# Patient Record
Sex: Female | Born: 1950 | Hispanic: No | Marital: Married | Smoking: Never smoker
Health system: Southern US, Community
[De-identification: ages and names within clinical notes are randomized; demographics above are authoritative.]

## PROBLEM LIST (undated history)

## (undated) DIAGNOSIS — I10 Essential (primary) hypertension: Secondary | ICD-10-CM

## (undated) HISTORY — DX: Essential (primary) hypertension: I10

---

## 1997-12-20 ENCOUNTER — Other Ambulatory Visit: Admission: RE | Admit: 1997-12-20 | Discharge: 1997-12-20 | Payer: Self-pay | Admitting: Family Medicine

## 1999-05-10 ENCOUNTER — Emergency Department (HOSPITAL_COMMUNITY): Admission: EM | Admit: 1999-05-10 | Discharge: 1999-05-10 | Payer: Self-pay | Admitting: Internal Medicine

## 1999-05-20 ENCOUNTER — Emergency Department (HOSPITAL_COMMUNITY): Admission: EM | Admit: 1999-05-20 | Discharge: 1999-05-21 | Payer: Self-pay | Admitting: Emergency Medicine

## 1999-05-20 ENCOUNTER — Encounter: Payer: Self-pay | Admitting: Emergency Medicine

## 2004-04-07 ENCOUNTER — Ambulatory Visit: Payer: Self-pay | Admitting: Internal Medicine

## 2004-05-07 ENCOUNTER — Ambulatory Visit: Payer: Self-pay | Admitting: *Deleted

## 2004-06-11 ENCOUNTER — Ambulatory Visit: Payer: Self-pay | Admitting: *Deleted

## 2004-08-12 ENCOUNTER — Ambulatory Visit: Payer: Self-pay | Admitting: Internal Medicine

## 2004-08-13 ENCOUNTER — Ambulatory Visit: Payer: Self-pay | Admitting: *Deleted

## 2004-08-18 ENCOUNTER — Encounter: Admission: RE | Admit: 2004-08-18 | Discharge: 2004-08-18 | Payer: Self-pay | Admitting: Family Medicine

## 2005-03-31 ENCOUNTER — Ambulatory Visit: Payer: Self-pay | Admitting: Internal Medicine

## 2005-11-25 ENCOUNTER — Ambulatory Visit: Payer: Self-pay | Admitting: Internal Medicine

## 2005-12-16 ENCOUNTER — Ambulatory Visit: Payer: Self-pay | Admitting: Internal Medicine

## 2006-05-11 ENCOUNTER — Ambulatory Visit: Payer: Self-pay | Admitting: Internal Medicine

## 2006-05-28 ENCOUNTER — Ambulatory Visit: Payer: Self-pay | Admitting: Family Medicine

## 2006-08-13 ENCOUNTER — Ambulatory Visit: Payer: Self-pay | Admitting: Internal Medicine

## 2006-09-15 ENCOUNTER — Ambulatory Visit: Payer: Self-pay | Admitting: Internal Medicine

## 2007-03-30 ENCOUNTER — Encounter (INDEPENDENT_AMBULATORY_CARE_PROVIDER_SITE_OTHER): Payer: Self-pay | Admitting: *Deleted

## 2007-08-25 ENCOUNTER — Ambulatory Visit: Payer: Self-pay | Admitting: Internal Medicine

## 2007-09-08 ENCOUNTER — Ambulatory Visit: Payer: Self-pay | Admitting: Internal Medicine

## 2007-09-08 LAB — CONVERTED CEMR LAB
BUN: 20 mg/dL (ref 6–23)
CO2: 23 meq/L (ref 19–32)
Calcium: 9.7 mg/dL (ref 8.4–10.5)
Chloride: 103 meq/L (ref 96–112)
Creatinine, Ser: 0.71 mg/dL (ref 0.40–1.20)
Glucose, Bld: 96 mg/dL (ref 70–99)
Potassium: 4.1 meq/L (ref 3.5–5.3)
Sodium: 142 meq/L (ref 135–145)

## 2007-09-22 ENCOUNTER — Ambulatory Visit (HOSPITAL_COMMUNITY): Admission: RE | Admit: 2007-09-22 | Discharge: 2007-09-22 | Payer: Self-pay | Admitting: Family Medicine

## 2008-03-20 ENCOUNTER — Ambulatory Visit: Payer: Self-pay | Admitting: Internal Medicine

## 2008-07-25 ENCOUNTER — Ambulatory Visit: Payer: Self-pay | Admitting: Internal Medicine

## 2008-10-31 ENCOUNTER — Encounter: Admission: RE | Admit: 2008-10-31 | Discharge: 2008-10-31 | Payer: Self-pay | Admitting: Family Medicine

## 2009-07-02 ENCOUNTER — Ambulatory Visit: Payer: Self-pay | Admitting: Family Medicine

## 2009-09-24 ENCOUNTER — Emergency Department (HOSPITAL_COMMUNITY): Admission: EM | Admit: 2009-09-24 | Discharge: 2009-09-24 | Payer: Self-pay | Admitting: Family Medicine

## 2009-11-01 ENCOUNTER — Encounter: Admission: RE | Admit: 2009-11-01 | Discharge: 2009-11-01 | Payer: Self-pay | Admitting: Family Medicine

## 2010-01-29 ENCOUNTER — Ambulatory Visit: Payer: Self-pay | Admitting: Internal Medicine

## 2010-01-29 LAB — CONVERTED CEMR LAB
ALT: 15 units/L (ref 0–35)
AST: 16 units/L (ref 0–37)
Basophils Absolute: 0 10*3/uL (ref 0.0–0.1)
Basophils Relative: 0 % (ref 0–1)
Creatinine, Ser: 0.58 mg/dL (ref 0.40–1.20)
Eosinophils Relative: 3 % (ref 0–5)
HCT: 38.6 % (ref 36.0–46.0)
Hemoglobin: 12.8 g/dL (ref 12.0–15.0)
MCHC: 33.2 g/dL (ref 30.0–36.0)
Monocytes Absolute: 0.6 10*3/uL (ref 0.1–1.0)
Neutro Abs: 2.2 10*3/uL (ref 1.7–7.7)
RDW: 13 % (ref 11.5–15.5)
Total Bilirubin: 0.3 mg/dL (ref 0.3–1.2)

## 2010-02-03 ENCOUNTER — Ambulatory Visit: Payer: Self-pay | Admitting: Internal Medicine

## 2010-02-03 LAB — CONVERTED CEMR LAB
CO2: 24 meq/L (ref 19–32)
Glucose, Bld: 98 mg/dL (ref 70–99)
Potassium: 3.8 meq/L (ref 3.5–5.3)
Sodium: 140 meq/L (ref 135–145)

## 2012-04-08 ENCOUNTER — Other Ambulatory Visit: Payer: Self-pay | Admitting: Internal Medicine

## 2012-04-08 DIAGNOSIS — N950 Postmenopausal bleeding: Secondary | ICD-10-CM

## 2012-04-22 ENCOUNTER — Ambulatory Visit
Admission: RE | Admit: 2012-04-22 | Discharge: 2012-04-22 | Disposition: A | Discharge status: Home / Self Care | Payer: No Typology Code available for payment source | Source: Ambulatory Visit | Attending: Internal Medicine | Admitting: Internal Medicine

## 2012-04-22 DIAGNOSIS — N950 Postmenopausal bleeding: Secondary | ICD-10-CM

## 2013-08-21 ENCOUNTER — Encounter: Payer: Self-pay | Admitting: *Deleted

## 2013-09-20 ENCOUNTER — Encounter: Payer: Self-pay | Admitting: Obstetrics & Gynecology

## 2013-10-30 ENCOUNTER — Other Ambulatory Visit (HOSPITAL_COMMUNITY)
Admission: RE | Admit: 2013-10-30 | Discharge: 2013-10-30 | Disposition: A | Discharge status: Home / Self Care | Payer: No Typology Code available for payment source | Source: Ambulatory Visit | Attending: Obstetrics & Gynecology | Admitting: Obstetrics & Gynecology

## 2013-10-30 ENCOUNTER — Ambulatory Visit (INDEPENDENT_AMBULATORY_CARE_PROVIDER_SITE_OTHER): Payer: No Typology Code available for payment source | Admitting: Obstetrics & Gynecology

## 2013-10-30 ENCOUNTER — Encounter: Payer: Self-pay | Admitting: Obstetrics & Gynecology

## 2013-10-30 VITALS — BP 153/86 | HR 86 | Ht 63.0 in | Wt 162.2 lb

## 2013-10-30 DIAGNOSIS — N841 Polyp of cervix uteri: Secondary | ICD-10-CM

## 2013-10-30 DIAGNOSIS — N950 Postmenopausal bleeding: Secondary | ICD-10-CM | POA: Insufficient documentation

## 2013-10-30 NOTE — Progress Notes (Signed)
Takes a bp medicine but doesn't know the name of it. She has been experiencing spotting for 3 days for the past two years monthly.

## 2013-10-31 NOTE — Patient Instructions (Signed)
Return to clinic for any scheduled appointments or for any gynecologic concerns as needed.   

## 2013-10-31 NOTE — Progress Notes (Signed)
CLINIC ENCOUNTER NOTE  History:  63 y.o. [id number] here today for evaluation of postmenopausal bleeding for 2 years.   Arabic speaking only, uses granddaughter as her interpreter and has signed consent declining professional interpreters. Bleeding is usually in the form of sporadic spotting. No pain or other associated systemic symptoms.  Patient had an ultrasound in 2013 which showed a thickened, abnormal endometrial stripe (see report below) and was referred to GYN clinic but she did not follow up with this referral.  The following portions of the patient's history were reviewed and updated as appropriate: allergies, current medications, past family history, past medical history, past social history, past surgical history and problem list. Does not remember when she last had a pap smear.  Review of Systems:  Pertinent items are noted in HPI.  Objective:  Physical Exam BP 153/86  Pulse 86  Ht 5\' 3"  (1.6 m)  Wt 162 lb 3.2 oz (73.573 kg)  BMI 28.74 kg/m2 Gen: NAD Abd: Soft, nontender and nondistended Pelvic: Normal appearing external genitalia with mild atrophy; atrophic vaginal mucosa and cervix.  2 cm x 7mm pink, polypoid lesion on a thin stalk noted to be hanging from the ectocervical canal.  Pap smear obtained. Normal discharge.  Small uterus, no other palpable masses, no uterine or adnexal tenderness.  CERVICAL POLYPECTOMY & ENDOMETRIAL BIOPSY     The indications for cervical polypectomy and endometrial biopsy were reviewed.   Risks of the procedures including cramping, bleeding, infection, uterine perforation, inadequate specimen and need for additional procedures  were discussed. The patient states she understands and agrees to undergo procedure today. Consent was signed. Time out was performed.   During the pelvic exam, the cervix was prepped with Betadine. Polypoid lesion noted in the ectocervix with narrow stalk. Ring forceps were used to grasp the polypoid lesion and the  polypoid lesion was removed by twisting it off its base.  Tissue sent to pathology for analysis.  Scant bleeding was noted.  A single-toothed tenaculum was then placed on the anterior lip of the cervix to stabilize it. The 3 mm pipelle was introduced into the endometrial cavity without difficulty to a depth of 7 cm, and a moderate amount of tissue was obtained after two passes and sent to pathology. The instruments were removed from the patient's vagina. Minimal bleeding from the cervix was noted. The patient tolerated the procedure well. Routine post-procedure instructions were given to the patient.    Labs and Imaging 04/08/2012  TRANSABDOMINAL AND TRANSVAGINAL ULTRASOUND OF PELVIS Clinical Data: Postmenopausal bleeding Comparison: None Findings: Uterus: Is anteverted and anteflexed and demonstrates a sagittal length of 6.9 cm, depth of 2.6 cm and width of 3.5 cm. No focal mural abnormality is noted Endometrium: Is thickened with a width of 13.4 mm and contains multiple small internal cystic foci. A mild amount of internal flow is noted with color Doppler exam. This is abnormal in a postmenopausal patient experiencing vaginal bleeding and could indicate underlying cystic hyperplasia, a large focal polyp with cystic change or the presence of endometrial carcinoma. Right ovary: Is not seen with confidence either transabdominally or endovaginally Left ovary: Has a normal appearance measuring 2.5 x 2.0 x 1.4 cm. Other findings: No pelvic fluid or separate adnexal masses are seen IMPRESSION: Thickened cystic appearing endometrium. In the setting of post-menopausal bleeding, endometrial sampling is indicated to exclude carcinoma. If results are benign, sonohysterogram should be considered for focal lesion work-up prior to hysteroscopy.Normal left ovary and non-visualized right ovary  Assessment & Plan:  Postmenopausal bleeding, abnormal EM on previous ultrasound Will follow up pap, polyp and endometrial biopsy  pathology results; patient understands the concern about hyperplasia/neoplasia in this setting Will obtain another ultrasound to evaluate for interval change Patient will be informed of all results and any plans for further evaluation/management   Jaynie CollinsUGONNA  ANYANWU, MD, FACOG Attending Obstetrician & Gynecologist Faculty Practice, [facility]

## 2013-11-01 ENCOUNTER — Encounter: Payer: Self-pay | Admitting: *Deleted

## 2013-11-02 ENCOUNTER — Telehealth: Payer: Self-pay | Admitting: *Deleted

## 2013-11-02 NOTE — Telephone Encounter (Signed)
Message copied by Gerome ApleyZEYFANG, Elio Haden L on Thu Nov 02, 2013 10:16 AM ------      Message from: Jaynie CollinsANYANWU, UGONNA A      Created: Thu Nov 02, 2013  8:27 AM       Benign endometrial biopsy and polyp. Please call to inform patient of results. Ask to speak to granddaughter; she translates for her mother (Arabic speaking only, signed form refusing interpreters). ------

## 2013-11-02 NOTE — Telephone Encounter (Addendum)
Called home number- left message we are calling for Lizbeth or   Ashley AkinRoda Jeylani( this is who signed as interpreter chosen by Shamaine at her visit 10/30/13)   We have some information for you, please call clinic with someone who speaks [place].  [phone]  Pt's granddaughter called back and I informed her of benign biopsy result. Roda asked about the US scheduled for 5/1 and why it was necessary. I explained that since the previous US last October showed a thickedned endometrial lining, it is necessary to re-evaluate the lining for any changes. She will receive another call once the result has been reviewed by Dr. Macon LargeAnyanwu.  Roda voiced understanding.

## 2013-11-10 ENCOUNTER — Ambulatory Visit (HOSPITAL_COMMUNITY)
Admission: RE | Admit: 2013-11-10 | Discharge: 2013-11-10 | Disposition: A | Discharge status: Home / Self Care | Payer: No Typology Code available for payment source | Source: Ambulatory Visit | Attending: Obstetrics & Gynecology | Admitting: Obstetrics & Gynecology

## 2013-11-10 DIAGNOSIS — R9389 Abnormal findings on diagnostic imaging of other specified body structures: Secondary | ICD-10-CM | POA: Insufficient documentation

## 2013-11-10 DIAGNOSIS — N950 Postmenopausal bleeding: Secondary | ICD-10-CM | POA: Insufficient documentation

## 2013-11-14 ENCOUNTER — Telehealth: Payer: Self-pay | Admitting: *Deleted

## 2013-11-14 ENCOUNTER — Encounter: Payer: Self-pay | Admitting: *Deleted

## 2013-11-14 NOTE — Telephone Encounter (Signed)
Message copied by Dorothyann PengHAIZLIP, Pang Robers E on Tue Nov 14, 2013 12:23 PM ------      Message from: Jaynie CollinsANYANWU, UGONNA A      Created: Tue Nov 14, 2013 11:01 AM       Ultrasound showed stable findings from previous ultrasound, thickened stripe, but she had negative biopsy.  Will continue to monitor. If bleeding continues, will need a hysteroscopy, D&C. Please call to inform patient of results and recommendations.       ------

## 2013-11-14 NOTE — Telephone Encounter (Signed)
Attempted to call patient with [employer], unable to leave a message. Will send certified letter.  Letter sent.

## 2013-12-08 ENCOUNTER — Encounter: Payer: Self-pay | Admitting: General Practice

## 2014-01-08 ENCOUNTER — Encounter: Payer: Self-pay | Admitting: General Practice

## 2014-05-14 ENCOUNTER — Encounter: Payer: Self-pay | Admitting: Obstetrics & Gynecology

## 2014-09-05 IMAGING — US US TRANSVAGINAL NON-OB
1 series · 13 of 25 positions shown · non-contrast
Comparison: 04/22/2012

CLINICAL DATA: Postmenopausal bleeding



[Series 1: us pelvis complete · 67 acquisitions, 13 frames shown]
[im 1/67]
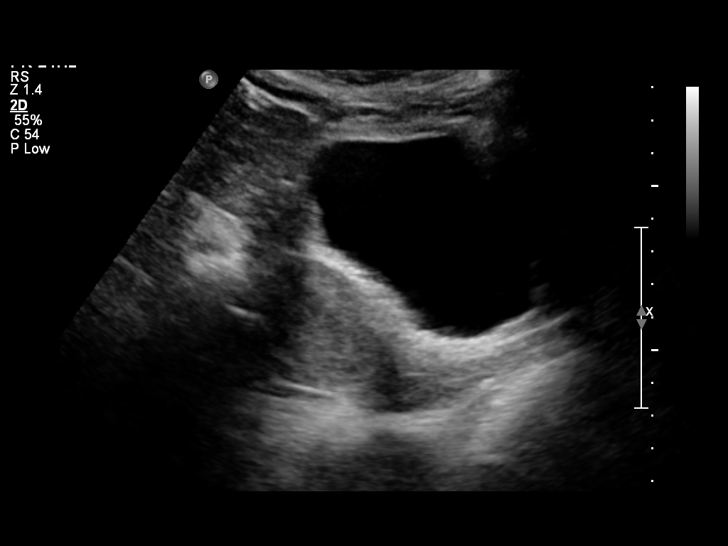
[im 6/67]
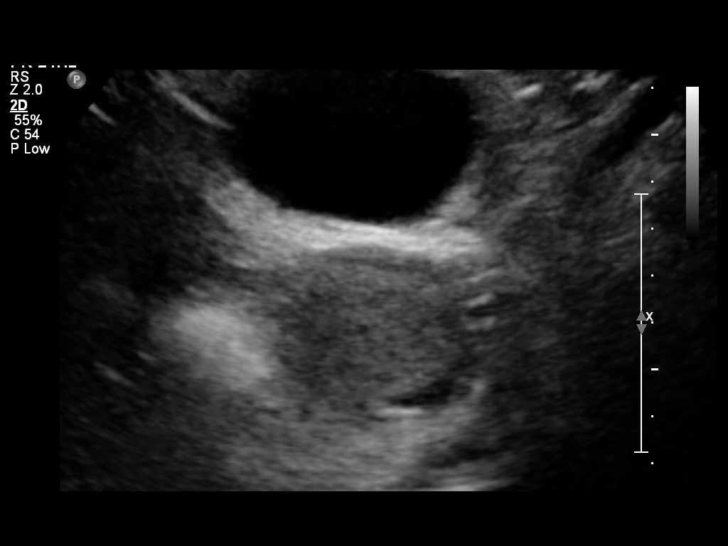
[im 12/67]
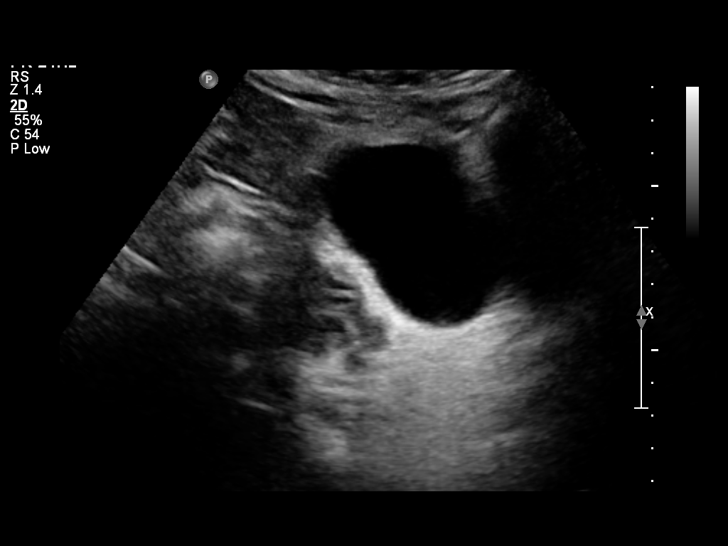
[im 17/67]
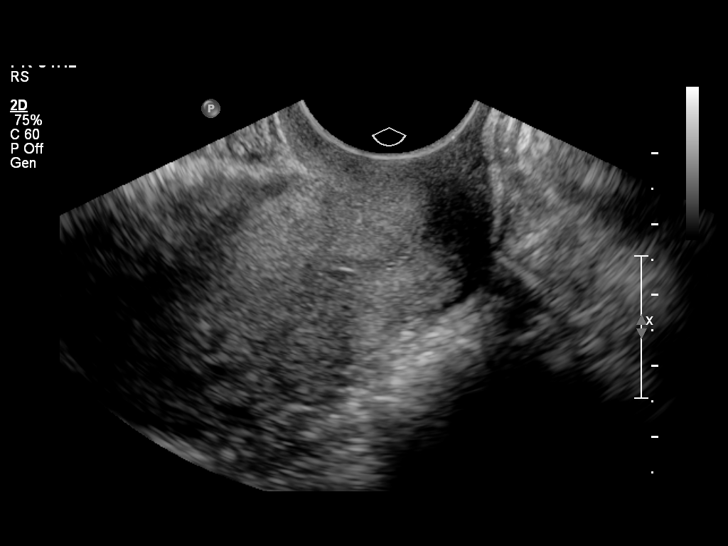
[im 23/67]
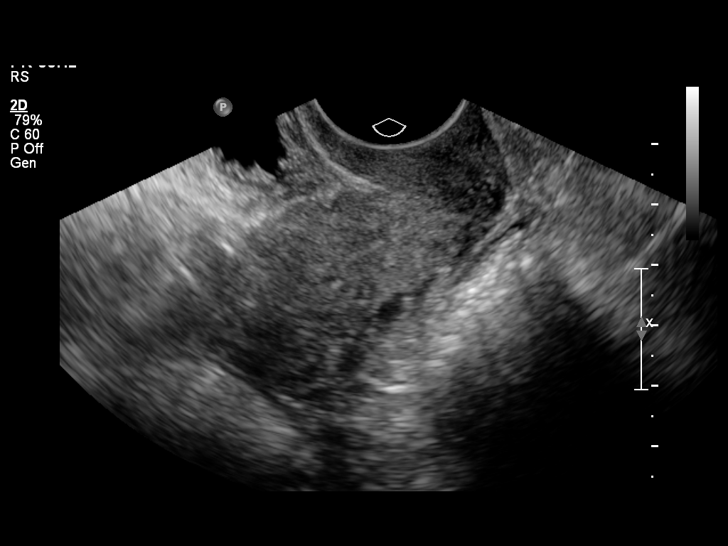
[im 28/67]
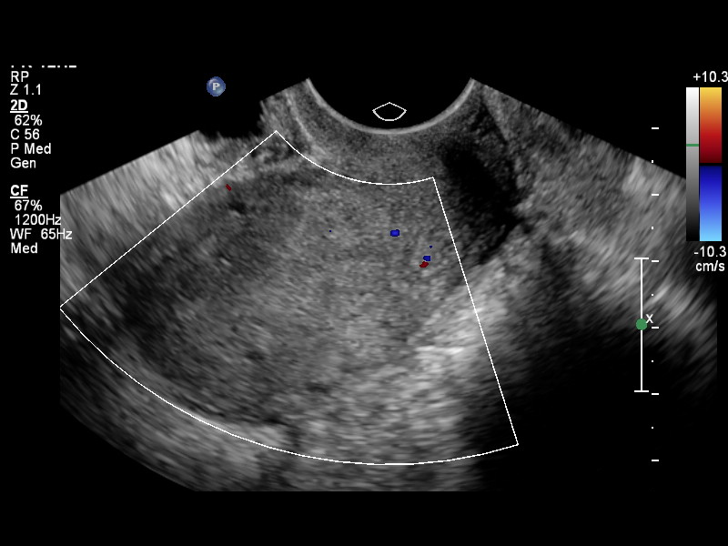
[im 34/67]
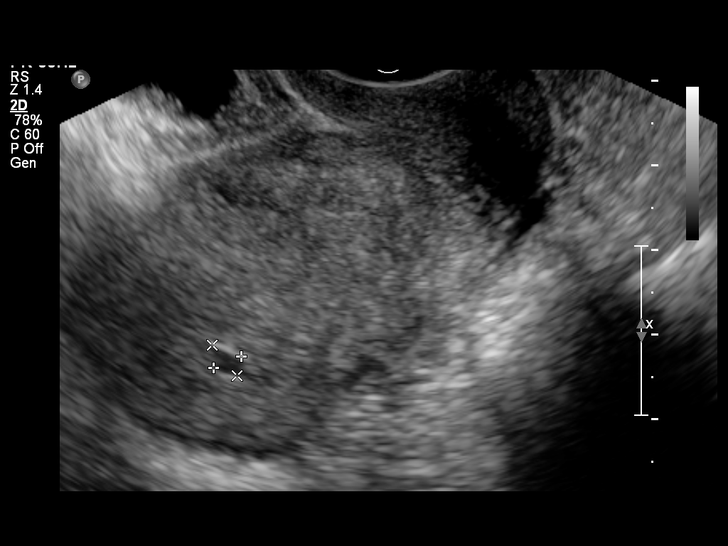
[im 39/67]
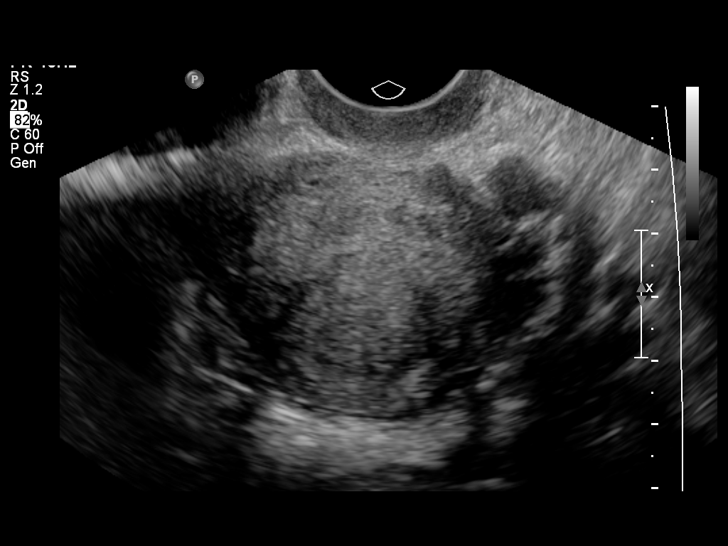
[im 45/67]
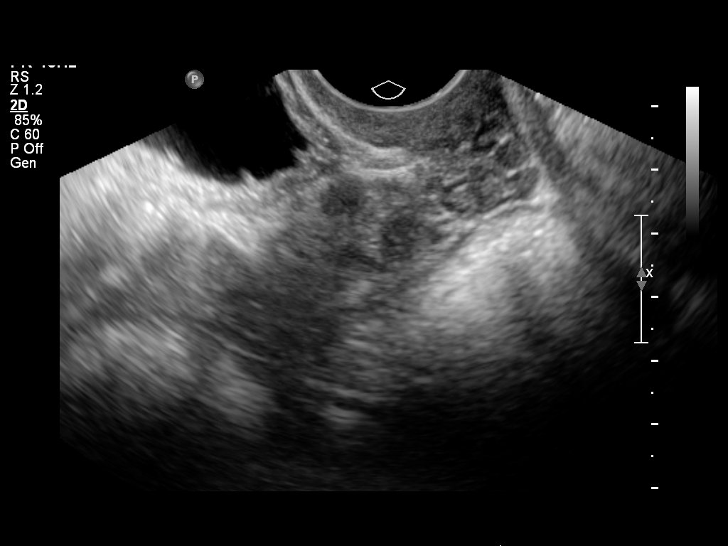
[im 50/67]
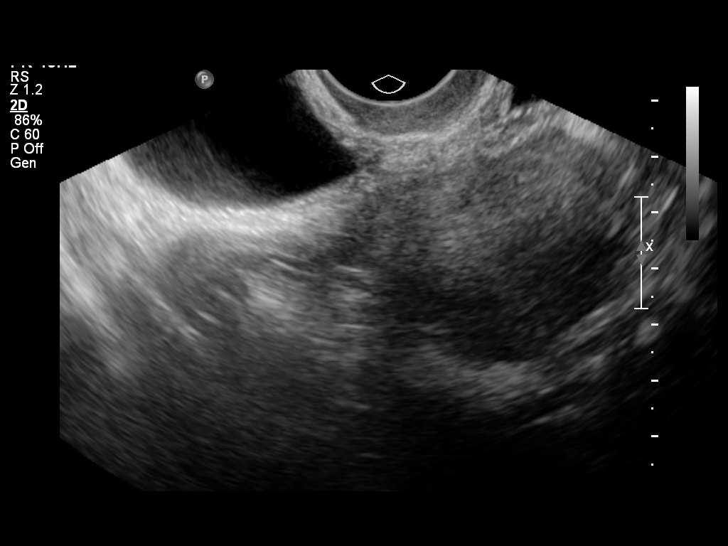
[im 56/67]
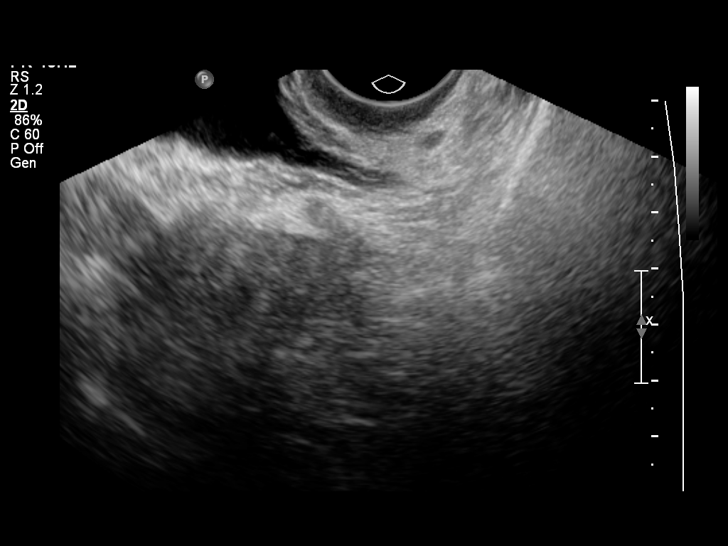
[im 61/67]
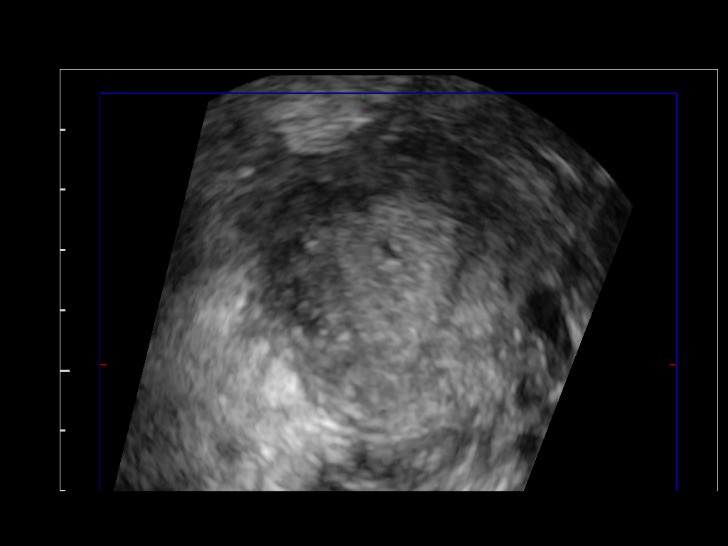
[im 67/67]
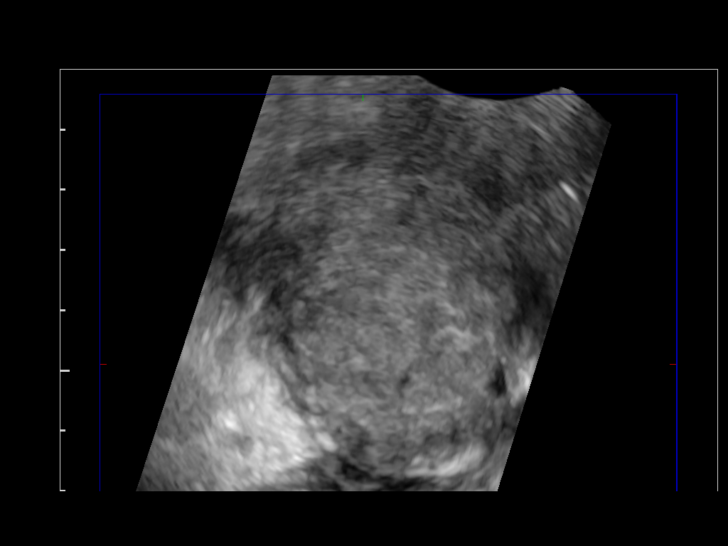

[13 of 25 positions shown; findings below may reference images not displayed]

FINDINGS: Uterus

Measurements: 6.8 x 3.9 x 4.6 cm. No fibroids or other mass
visualized. Heterogeneous echotexture.

Endometrium

Thickness: Mildly thickened for patient's age at 10 mm. No focal
abnormality. 5 mm cystic area in the endometrium.

Right ovary

Measurements: Not visualized. No adnexal masses

Left ovary

Measurements: Not visualized. No adnexal masses

Other findings

No free fluid.
IMPRESSION: Diffusely thickened endometrium at 10 mm. Small cystic area noted as
seen on prior ultrasound. In the setting of post-menopausal
bleeding, endometrial sampling is indicated to exclude carcinoma. If
results are benign, sonohysterogram should be considered for focal
lesion work-up. (Ref: Radiological Reasoning: Algorithmic Workup of
Abnormal Vaginal Bleeding with Endovaginal Sonography and
Sonohysterography. AJR 0226; 191:S68-73)

## 2015-03-18 ENCOUNTER — Encounter (HOSPITAL_COMMUNITY): Payer: Self-pay | Admitting: Emergency Medicine

## 2015-03-18 ENCOUNTER — Emergency Department (INDEPENDENT_AMBULATORY_CARE_PROVIDER_SITE_OTHER): Payer: No Typology Code available for payment source

## 2015-03-18 ENCOUNTER — Emergency Department (HOSPITAL_COMMUNITY)
Admission: EM | Admit: 2015-03-18 | Discharge: 2015-03-18 | Disposition: A | Discharge status: Home / Self Care | Payer: No Typology Code available for payment source | Source: Home / Self Care | Attending: Family Medicine | Admitting: Family Medicine

## 2015-03-18 DIAGNOSIS — M79672 Pain in left foot: Secondary | ICD-10-CM

## 2015-03-18 DIAGNOSIS — S93602A Unspecified sprain of left foot, initial encounter: Secondary | ICD-10-CM

## 2015-03-18 NOTE — ED Notes (Signed)
Pt has pain and swelling in her left foot.  The pain seems to be centered around her 2nd toe, on the top of her foot and the base of her foot.  She states the pain is mostly with ambulation, but does complain of some burning with rest.  They states the swelling will go down with elevation.

## 2015-03-18 NOTE — ED Provider Notes (Signed)
CSN: [id number]     Arrival date & time 03/18/15  1819 History   First MD Initiated Contact with Patient 03/18/15 1940     Chief Complaint  Patient presents with  . Foot Pain   (Consider location/radiation/quality/duration/timing/severity/associated sxs/prior Treatment) HPI Comments: 64 year old [place] female is accompanied by her English-speaking significant other complaining of left foot pain and burning sensation to the forefoot and plantar aspect of the foot for several weeks. She has no known injury or trauma. Pain is constant and persistent. It is unchanged. There is swelling to the foot. The greatest pain is at the base of the second toe. It is worse with weightbearing. It is better with off weight.    Past Medical History  Diagnosis Date  . Hypertension    History reviewed. No pertinent past surgical history. History reviewed. No pertinent family history. Social History  Substance Use Topics  . Smoking status: Never Smoker   . Smokeless tobacco: Never Used  . Alcohol Use: No   OB History    Gravida Para Term Preterm AB TAB SAB Ectopic Multiple Living   11 10 10  1  1   10      Review of Systems  Constitutional: Positive for activity change. Negative for fever.  Musculoskeletal: Negative for myalgias and back pain.       Left foot pain as above.  Skin: Negative.   All other systems reviewed and are negative.   Allergies  Review of patient's allergies indicates no known allergies.  Home Medications   Prior to Admission medications   Medication Sig Start Date End Date Taking? Authorizing Provider  amLODipine (NORVASC) 10 MG tablet Take 10 mg by mouth daily.   Yes Historical Provider, MD  aspirin 81 MG tablet Take 81 mg by mouth daily.   Yes Historical Provider, MD  hydrochlorothiazide (HYDRODIURIL) 25 MG tablet Take 25 mg by mouth daily.   Yes Historical Provider, MD  metoprolol succinate (TOPROL-XL) 50 MG 24 hr tablet Take 50 mg by mouth daily. Take with or  immediately following a meal.   Yes Historical Provider, MD   Meds Ordered and Administered this Visit  Medications - No data to display  BP 141/92 mmHg  Pulse 85  Temp(Src) 98.4 F (36.9 C) (Oral)  Resp 16  SpO2 97% No data found.   Physical Exam  Constitutional: She appears well-developed and well-nourished. No distress.  Pulmonary/Chest: Effort normal. No respiratory distress.  Musculoskeletal: Normal range of motion. She exhibits edema and tenderness.  Left foot with mild nonpitting edema. Tenderness to the forefoot with the greatest amount of tenderness at the distal second metatarsal and base of the second toe. There is tenderness with deep palpation to the plantar aspect of the forefoot. No apparent skin injury. No discoloration.  Neurological: She is alert. She exhibits normal muscle tone.  Skin: Skin is warm and dry.  Nursing note and vitals reviewed.   ED Course  Procedures (including critical care time)  Labs Review Labs Reviewed - No data to display  Imaging Review Dg Foot Complete Left  03/18/2015   CLINICAL DATA:  Left foot pain, no injury  EXAM: LEFT FOOT - COMPLETE 3+ VIEW  COMPARISON:  None.  FINDINGS: Negative for fracture. Mild degenerative change in the first MTP joint. No erosions. No other arthropathy.  IMPRESSION: Mild degenerative change first MTP joint.   Electronically Signed   By: Marlan Palau M.D.   On: 03/18/2015 21:12     Visual Acuity  Review  Right Eye Distance:   Left Eye Distance:   Bilateral Distance:    Right Eye Near:   Left Eye Near:    Bilateral Near:         MDM   1. Foot sprain, left, initial encounter   2. Foot pain, left    ACE wrap left foot Elevate Limit wt bearing.  Call the [facility] this week for appt,. Call PCP for appt for BP checks.   Hayden Rasmussen, NP 03/18/15 2126

## 2015-03-18 NOTE — Discharge Instructions (Signed)
Foot Sprain °The muscles and cord like structures which attach muscle to bone (tendons) that surround the feet are made up of units. A foot sprain can occur at the weakest spot in any of these units. This condition is most often caused by injury to or overuse of the foot, as from playing contact sports, or aggravating a previous injury, or from poor conditioning, or obesity. °SYMPTOMS °· Pain with movement of the foot. °· Tenderness and swelling at the injury site. °· Loss of strength is present in moderate or severe sprains. °THE THREE GRADES OR SEVERITY OF FOOT SPRAIN ARE: °· Mild (Grade I): Slightly pulled muscle without tearing of muscle or tendon fibers or loss of strength. °· Moderate (Grade II): Tearing of fibers in a muscle, tendon, or at the attachment to bone, with small decrease in strength. °· Severe (Grade III): Rupture of the muscle-tendon-bone attachment, with separation of fibers. Severe sprain requires surgical repair. Often repeating (chronic) sprains are caused by overuse. Sudden (acute) sprains are caused by direct injury or over-use. °DIAGNOSIS  °Diagnosis of this condition is usually by your own observation. If problems continue, a caregiver may be required for further evaluation and treatment. X-rays may be required to make sure there are not breaks in the bones (fractures) present. Continued problems may require physical therapy for treatment. °PREVENTION °· Use strength and conditioning exercises appropriate for your sport. °· Warm up properly prior to working out. °· Use athletic shoes that are made for the sport you are participating in. °· Allow adequate time for healing. Early return to activities makes repeat injury more likely, and can lead to an unstable arthritic foot that can result in prolonged disability. Mild sprains generally heal in 3 to 10 days, with moderate and severe sprains taking 2 to 10 weeks. Your caregiver can help you determine the proper time required for  healing. °HOME CARE INSTRUCTIONS  °· Apply ice to the injury for 15-20 minutes, 03-04 times per day. Put the ice in a plastic bag and place a towel between the bag of ice and your skin. °· An elastic wrap (like an Ace bandage) may be used to keep swelling down. °· Keep foot above the level of the heart, or at least raised on a footstool, when swelling and pain are present. °· Try to avoid use other than gentle range of motion while the foot is painful. Do not resume use until instructed by your caregiver. Then begin use gradually, not increasing use to the point of pain. If pain does develop, decrease use and continue the above measures, gradually increasing activities that do not cause discomfort, until you gradually achieve normal use. °· Use crutches if and as instructed, and for the length of time instructed. °· Keep injured foot and ankle wrapped between treatments. °· Massage foot and ankle for comfort and to keep swelling down. Massage from the toes up towards the knee. °· Only take over-the-counter or prescription medicines for pain, discomfort, or fever as directed by your caregiver. °SEEK IMMEDIATE MEDICAL CARE IF:  °· Your pain and swelling increase, or pain is not controlled with medications. °· You have loss of feeling in your foot or your foot turns cold or blue. °· You develop new, unexplained symptoms, or an increase of the symptoms that brought you to your caregiver. °MAKE SURE YOU:  °· Understand these instructions. °· Will watch your condition. °· Will get help right away if you are not doing well or get worse. °Document Released:   12/19/2001 Document Revised: 09/21/2011 Document Reviewed: 02/16/2008 °ExitCare® Patient Information ©2015 [employer]. This information is not intended to replace advice given to you by your health care provider. Make sure you discuss any questions you have with your health care provider. ° °Ligament Sprain °Ligaments are tough, fibrous tissues that hold bones  together at the joints. A sprain can occur when a ligament is stretched. This injury may take several weeks to heal. °HOME CARE INSTRUCTIONS  °· Rest the injured area for as long as directed by your caregiver. Then slowly start using the joint as directed by your caregiver and as the pain allows. °· Keep the affected joint raised if possible to lessen swelling. °· Apply ice for 15-20 minutes to the injured area every couple hours for the first half day, then 03-04 times per day for the first 48 hours. Put the ice in a plastic bag and place a towel between the bag of ice and your skin. °· Wear any splinting, casting, or elastic bandage applications as instructed. °· Only take over-the-counter or prescription medicines for pain, discomfort, or fever as directed by your caregiver. Do not use aspirin immediately after the injury unless instructed by your caregiver. Aspirin can cause increased bleeding and bruising of the tissues. °· If you were given crutches, continue to use them as instructed and do not resume weight bearing on the affected extremity until instructed. °SEEK MEDICAL CARE IF:  °· Your bruising, swelling, or pain increases. °· You have cold and numb fingers or toes if your arm or leg was injured. °SEEK IMMEDIATE MEDICAL CARE IF:  °· Your toes are numb or blue if your leg was injured. °· Your fingers are numb or blue if your arm was injured. °· Your pain is not responding to medicines and continues to stay the same or gets worse. °MAKE SURE YOU:  °· Understand these instructions. °· Will watch your condition. °· Will get help right away if you are not doing well or get worse. °Document Released: 06/26/2000 Document Revised: 09/21/2011 Document Reviewed: 04/24/2008 °ExitCare® Patient Information ©2015 [employer]. This information is not intended to replace advice given to you by your health care provider. Make sure you discuss any questions you have with your health care provider. ° °

## 2016-01-11 IMAGING — DX DG FOOT COMPLETE 3+V*L*
3 series · 3 of 3 positions shown · non-contrast
Comparison: None.

CLINICAL DATA: Left foot pain, no injury

EXAM:
LEFT FOOT - COMPLETE 3+ VIEW

[foot ap]
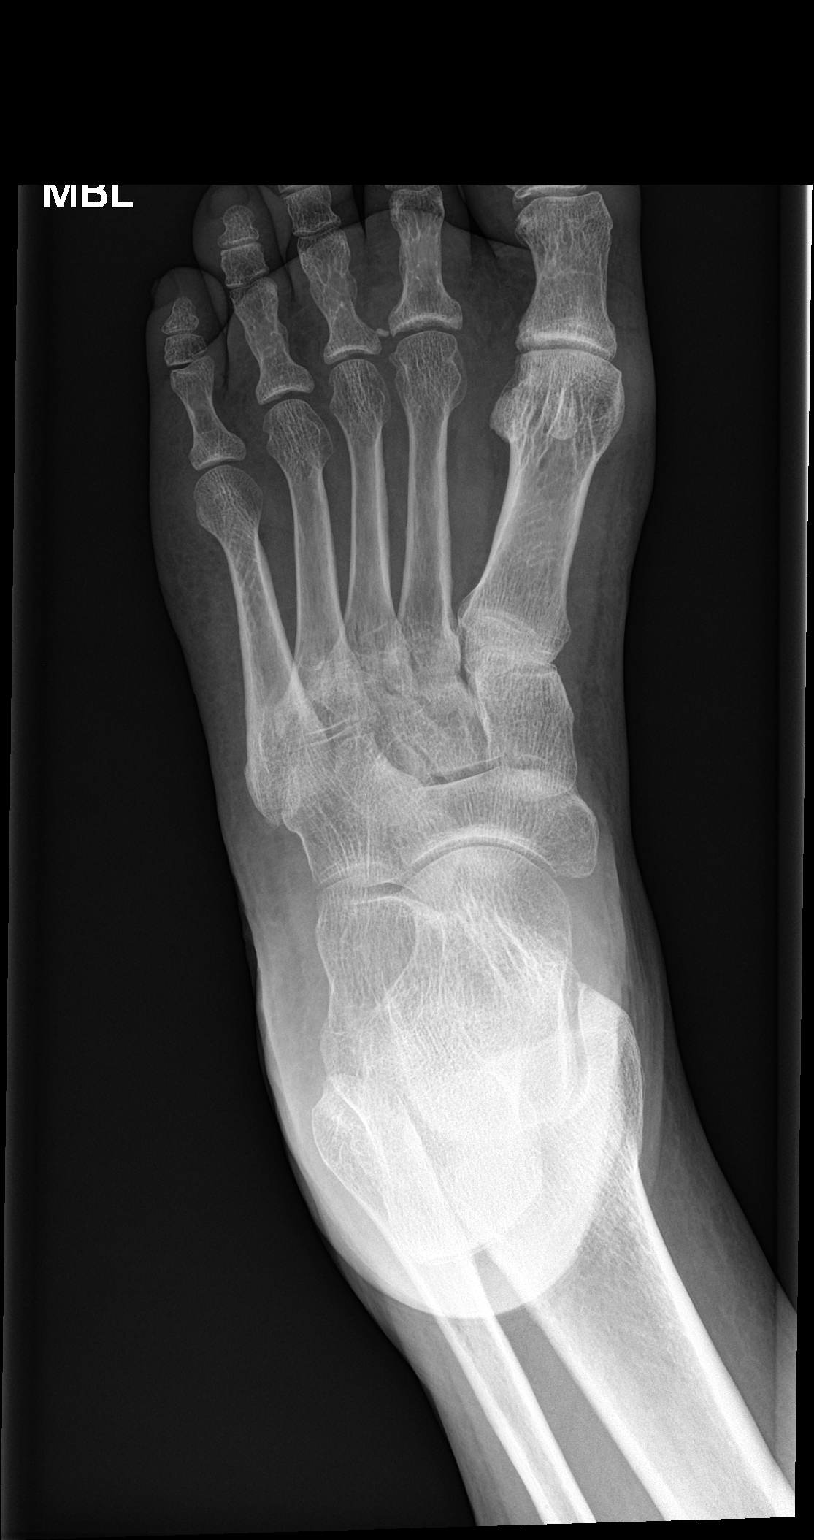

[foot obl]
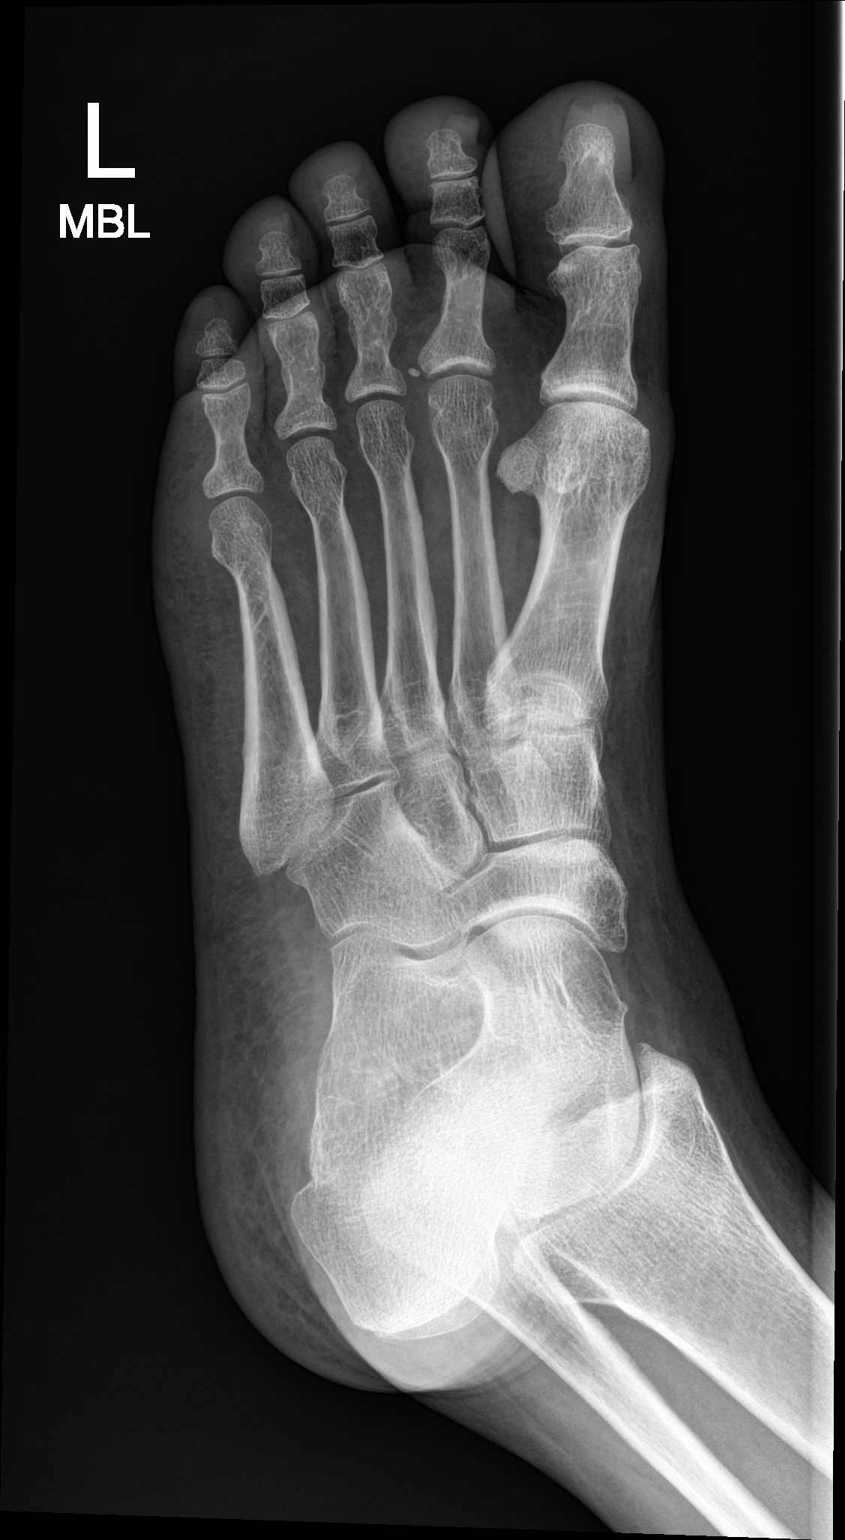

[foot lat]
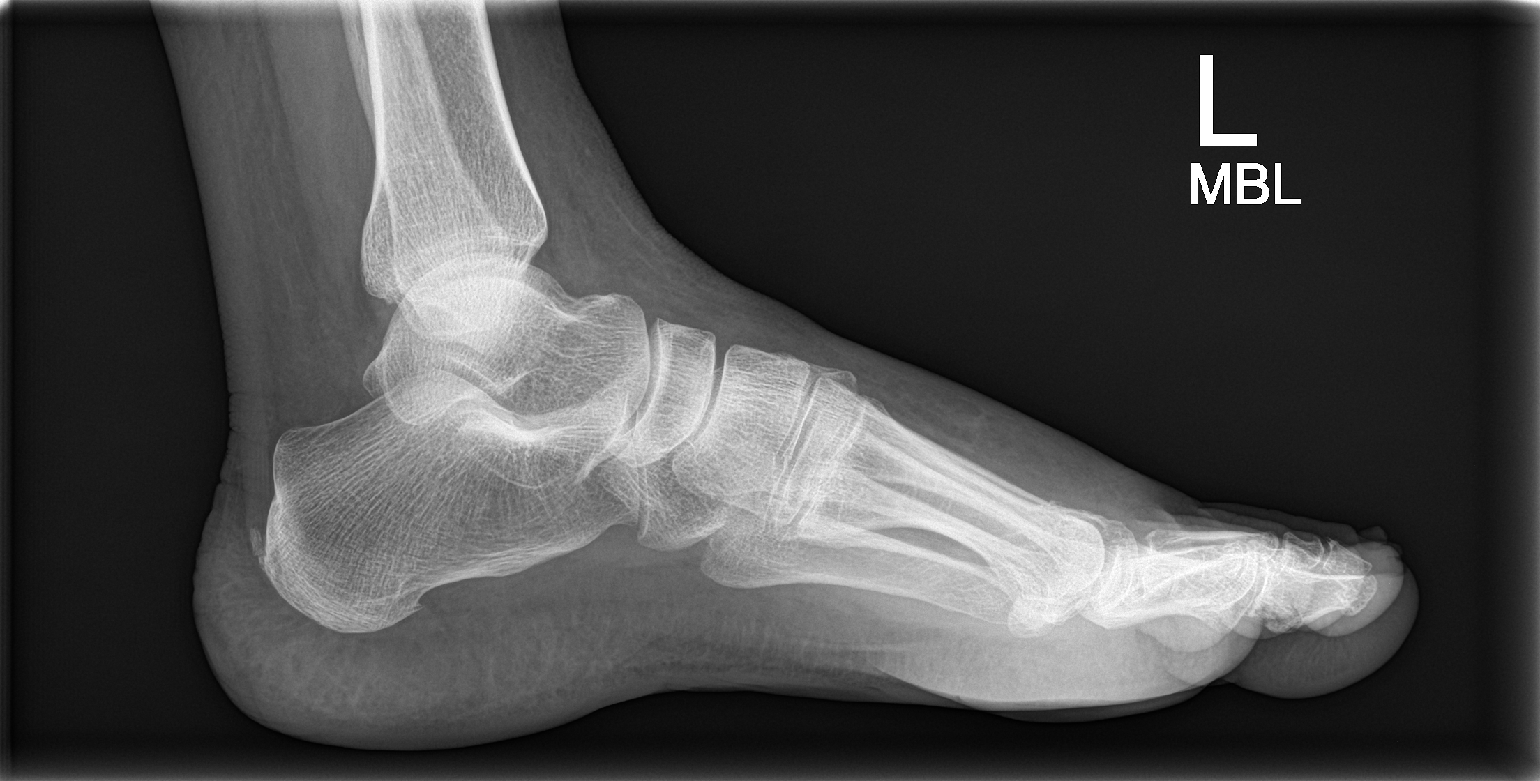

[3 of 3 positions shown; findings below may reference images not displayed]

FINDINGS: Negative for fracture. Mild degenerative change in the first MTP
joint. No erosions. No other arthropathy.
IMPRESSION: Mild degenerative change first MTP joint.
